# Patient Record
Sex: Female | Born: 1960 | Race: White | Hispanic: No | Marital: Married | Smoking: Never smoker
Health system: Southern US, Community
[De-identification: ages and names within clinical notes are randomized; demographics above are authoritative.]

## PROBLEM LIST (undated history)

## (undated) DIAGNOSIS — S82899A Other fracture of unspecified lower leg, initial encounter for closed fracture: Secondary | ICD-10-CM

---

## 2016-07-10 ENCOUNTER — Encounter (HOSPITAL_COMMUNITY): Payer: Self-pay | Admitting: Emergency Medicine

## 2016-07-10 ENCOUNTER — Emergency Department (HOSPITAL_COMMUNITY): Payer: Self-pay

## 2016-07-10 ENCOUNTER — Emergency Department (HOSPITAL_COMMUNITY)
Admission: EM | Admit: 2016-07-10 | Discharge: 2016-07-10 | Disposition: A | Discharge status: Home / Self Care | Payer: Self-pay | Attending: Emergency Medicine | Admitting: Emergency Medicine

## 2016-07-10 DIAGNOSIS — Y999 Unspecified external cause status: Secondary | ICD-10-CM | POA: Insufficient documentation

## 2016-07-10 DIAGNOSIS — Y939 Activity, unspecified: Secondary | ICD-10-CM | POA: Insufficient documentation

## 2016-07-10 DIAGNOSIS — W010XXA Fall on same level from slipping, tripping and stumbling without subsequent striking against object, initial encounter: Secondary | ICD-10-CM | POA: Insufficient documentation

## 2016-07-10 DIAGNOSIS — S82891A Other fracture of right lower leg, initial encounter for closed fracture: Secondary | ICD-10-CM | POA: Insufficient documentation

## 2016-07-10 DIAGNOSIS — Y929 Unspecified place or not applicable: Secondary | ICD-10-CM | POA: Insufficient documentation

## 2016-07-10 MED ORDER — OXYCODONE-ACETAMINOPHEN 5-325 MG PO TABS: 1.0000 | ORAL_TABLET | ORAL | 0 refills | Status: DC | PRN

## 2016-07-10 MED ORDER — HYDROMORPHONE HCL 1 MG/ML IJ SOLN
1.0000 mg | Freq: Once | INTRAMUSCULAR | Status: AC
  Administered 2016-07-10: 1 mg via INTRAVENOUS
  Filled 2016-07-10: qty 1

## 2016-07-10 MED ORDER — ETOMIDATE 2 MG/ML IV SOLN
0.2000 mg/kg | Freq: Once | INTRAVENOUS | Status: AC
  Administered 2016-07-10: 15 mg via INTRAVENOUS
  Filled 2016-07-10: qty 10

## 2016-07-10 NOTE — ED Triage Notes (Signed)
Per EMS pt slipped on wet carpet and landed on right ankle. Pt has obvious swelling and deformity to right ankle. Able to move all toes and strong pulse.

## 2016-07-10 NOTE — ED Provider Notes (Signed)
[facility]-EMERGENCY DEPT Provider Note   CSN: [id number] Arrival date & time: 07/10/16  1437     History   Chief Complaint Chief Complaint  Patient presents with  . Ankle Injury    HPI Sherry Cross is a 56 y.o. female.  HPI Patient presents the emergency department complaining of severe pain in her right ankle after tripping and falling today and slipping on wet ground.  She presents with obvious deformity consistent with closed right ankle fracture.  Tubular toes.  She denies knee pain.  No hip pain.  No head injury.  Denies neck pain.   History reviewed. No pertinent past medical history.  There are no active problems to display for this patient.   History reviewed. No pertinent surgical history.  OB History    No data available       Home Medications    Prior to Admission medications   Not on File    Family History No family history on file.  Social History Social History  Substance Use Topics  . Smoking status: Never Smoker  . Smokeless tobacco: Not on file  . Alcohol use No     Allergies   Patient has no known allergies.   Review of Systems Review of Systems  All other systems reviewed and are negative.    Physical Exam Updated Vital Signs BP 138/80   Pulse 73   Temp 98.3 F (36.8 C) (Oral)   Resp (!) 23   Ht  (1.676 m)   Wt 180 lb (81.6 kg)   SpO2 97%   BMI 29.05 kg/m   Physical Exam  Constitutional: She is oriented to person, place, and time. She appears well-developed and well-nourished.  HENT:  Head: Normocephalic.  Eyes: EOM are normal.  Neck: Normal range of motion. Neck supple.  Cardiovascular: Normal rate.   Pulmonary/Chest: Effort normal. She exhibits no tenderness.  Abdominal: She exhibits no distension. There is no tenderness.  Musculoskeletal: Normal range of motion.  Obvious deformity of the right ankle with small abrasion to the right medial malleolus.  There is no open component to this fracture.  Normal  pulses in the right foot.  Compartments are soft.  Neurological: She is alert and oriented to person, place, and time.  Psychiatric: She has a normal mood and affect.  Nursing note and vitals reviewed.    ED Treatments / Results  Labs (all labs ordered are listed, but only abnormal results are displayed) Labs Reviewed - No data to display  EKG  EKG Interpretation None       Radiology Dg Ankle Complete Right  Result Date: 07/10/2016 CLINICAL DATA:  Status post reduction of right ankle fracture. EXAM: RIGHT ANKLE - COMPLETE 3+ VIEW COMPARISON:  Radiographs of same day. FINDINGS: There has been casting and immobilization of the moderately displaced oblique fracture involving the distal right fibula. There is improved alignment of fracture components compared to prior exam. Posterior dislocation of talus relative to tibia has been successfully reduced. IMPRESSION: Successful reduction of talotibial dislocation. Improved alignment of distal right fibular fracture. Electronically Signed   By: Lupita Raider, M.D.   On: 07/10/2016 16:24   Dg Ankle Complete Right  Result Date: 07/10/2016 CLINICAL DATA:  Twisting injury today. Right ankle pain and swelling. Initial encounter. EXAM: RIGHT ANKLE - COMPLETE 3+ VIEW COMPARISON:  None. FINDINGS: The bones are demineralized. There is a comminuted, moderately displaced and angulated fracture of the distal fibula with resulting posterolateral subluxation of the talus  relative to the tibial plafond. The medial malleolus appears intact, although there is a suspected fracture of the tibial plafond posteriorly on the lateral view. The tibiotalar joint is widened anteriorly and medially. No tarsal bone fractures are seen. IMPRESSION: Right ankle fracture/ dislocation as described with comminuted and displaced fracture of the distal fibula, posterolateral displacement of the talus and associated deltoid ligamentous injury. Suspected posterior tibial plafond  fracture. Electronically Signed   By:Carey Bullocksazey M.D.   On: 07/10/2016 15:18    +++++++++++++++++++++++++++++++++++++++++++++++++++  Procedures Reduction of Fracture Dislocation Performed by: Azalia Bilis Authorized by: Azalia Bilis    Consent: Verbal consent obtained. Risks and benefits: risks, benefits and alternatives were discussed Consent given by: patient Required items: required blood products, implants, devices, and special equipment available Time out: Immediately prior to procedure a "time out" was called to verify the correct patient, procedure, equipment, support staff and site/side marked as required. Patient sedated: YES Vitals: Vital signs were monitored during sedation. Patient tolerance: Patient tolerated the procedure well with no immediate complications. Joint: right ankle Reduction technique: manipulation  Procedural sedation Performed by: Lyanne Co Consent: Verbal consent obtained. Risks and benefits: risks, benefits and alternatives were discussed Required items: required blood products, implants, devices, and special equipment available Patient identity confirmed: arm band and provided demographic data Time out: Immediately prior to procedure a "time out" was called to verify the correct patient, procedure, equipment, support staff and site/side marked as required. Sedation type: moderate (conscious) sedation NPO time confirmed and considedered Sedatives: ETOMIDATE Physician Time at Bedside: 12 Vitals: Vital signs were monitored during sedation. Cardiac Monitor, pulse oximeter Patient tolerance: Patient tolerated the procedure well with no immediate complications. Comments: Pt with uneventful recovered. Returned to pre-procedural sedation baseline   SPLINT APPLICATION Authorized by: Lyanne Co Consent: Verbal consent obtained. Risks and benefits: risks, benefits and alternatives were discussed Consent given by: patient Splint applied  by: orthopedic technician Location details: right lower leg Splint type: shortleg with stirrup Supplies used: plaster Post-procedure: The splinted body part was neurovascularly unchanged following the procedure. Patient tolerance: Patient tolerated the procedure well with no immediate complications.  +++++++++++++++++++++++++++++++++++++++++++++++++    Medications Ordered in ED Medications  HYDROmorphone (DILAUDID) injection 1 mg (1 mg Intravenous Given 07/10/16 1531)  etomidate (AMIDATE) injection 16.32 mg (15 mg Intravenous Given 07/10/16 1544)     Initial Impression / Assessment and Plan / ED Course  I have reviewed the triage vital signs and the nursing notes.  Pertinent labs & imaging results that were available during my care of the patient were reviewed by me and considered in my medical decision making (see chart for details).     Reduction of fracture dislocation.  Patient will follow-up with orthopedic surgery.  Home with pain medicine, elevation, nonweightbearing status right lower extremity  Final Clinical Impressions(s) / ED Diagnoses   Final diagnoses:  Closed right ankle fracture, initial encounter    New Prescriptions Discharge Medication List as of 07/10/2016  4:42 PM    START taking these medications   Details  oxyCODONE-acetaminophen (PERCOCET/ROXICET) 5-325 MG tablet Take 1 tablet by mouth every 4 (four) hours as needed for severe pain., Starting Mon 07/10/2016, Print         Azalia Bilis, MD 07/10/16 [phone]

## 2016-07-10 NOTE — ED Notes (Signed)
Bed: WA06 Expected date:  Expected time:  Means of arrival: Ambulance Comments: EMS ankle deformity 56 yo F

## 2016-07-19 ENCOUNTER — Encounter (HOSPITAL_BASED_OUTPATIENT_CLINIC_OR_DEPARTMENT_OTHER): Payer: Self-pay | Admitting: *Deleted

## 2016-07-20 ENCOUNTER — Ambulatory Visit (HOSPITAL_BASED_OUTPATIENT_CLINIC_OR_DEPARTMENT_OTHER)
Admission: RE | Admit: 2016-07-20 | Discharge: 2016-07-20 | Disposition: A | Discharge status: Home / Self Care | Payer: Self-pay | Source: Ambulatory Visit | Attending: Orthopedic Surgery | Admitting: Orthopedic Surgery

## 2016-07-20 ENCOUNTER — Encounter (HOSPITAL_BASED_OUTPATIENT_CLINIC_OR_DEPARTMENT_OTHER)
Admission: RE | Disposition: A | Discharge status: Home / Self Care | Payer: Self-pay | Source: Ambulatory Visit | Attending: Orthopedic Surgery

## 2016-07-20 ENCOUNTER — Encounter (HOSPITAL_BASED_OUTPATIENT_CLINIC_OR_DEPARTMENT_OTHER): Payer: Self-pay | Admitting: *Deleted

## 2016-07-20 ENCOUNTER — Ambulatory Visit (HOSPITAL_BASED_OUTPATIENT_CLINIC_OR_DEPARTMENT_OTHER): Payer: Self-pay | Admitting: Anesthesiology

## 2016-07-20 DIAGNOSIS — S8261XA Displaced fracture of lateral malleolus of right fibula, initial encounter for closed fracture: Secondary | ICD-10-CM | POA: Insufficient documentation

## 2016-07-20 DIAGNOSIS — W109XXA Fall (on) (from) unspecified stairs and steps, initial encounter: Secondary | ICD-10-CM | POA: Insufficient documentation

## 2016-07-20 DIAGNOSIS — Z79899 Other long term (current) drug therapy: Secondary | ICD-10-CM | POA: Insufficient documentation

## 2016-07-20 HISTORY — PX: ORIF ANKLE FRACTURE: SHX5408

## 2016-07-20 HISTORY — DX: Other fracture of unspecified lower leg, initial encounter for closed fracture: S82.899A

## 2016-07-20 SURGERY — OPEN REDUCTION INTERNAL FIXATION (ORIF) ANKLE FRACTURE
Anesthesia: General | Site: Ankle | Laterality: Right

## 2016-07-20 MED ORDER — POVIDONE-IODINE 10 % EX SWAB: 2.0000 "application " | Freq: Once | CUTANEOUS | Status: DC

## 2016-07-20 MED ORDER — FENTANYL CITRATE (PF) 100 MCG/2ML IJ SOLN
INTRAMUSCULAR | Status: AC
Start: 2016-07-20 — End: 2016-07-20
  Filled 2016-07-20: qty 2

## 2016-07-20 MED ORDER — MIDAZOLAM HCL 2 MG/2ML IJ SOLN
1.0000 mg | INTRAMUSCULAR | Status: DC | PRN
  Administered 2016-07-20: 2 mg via INTRAVENOUS

## 2016-07-20 MED ORDER — SCOPOLAMINE 1 MG/3DAYS TD PT72: 1.0000 | MEDICATED_PATCH | Freq: Once | TRANSDERMAL | Status: DC | PRN

## 2016-07-20 MED ORDER — CHLORHEXIDINE GLUCONATE 4 % EX LIQD: 60.0000 mL | Freq: Once | CUTANEOUS | Status: DC

## 2016-07-20 MED ORDER — ONDANSETRON HCL 4 MG/2ML IJ SOLN
INTRAMUSCULAR | Status: AC
Start: 2016-07-20 — End: 2016-07-20
  Filled 2016-07-20: qty 2

## 2016-07-20 MED ORDER — CEFAZOLIN SODIUM-DEXTROSE 2-4 GM/100ML-% IV SOLN
INTRAVENOUS | Status: AC
Start: 2016-07-20 — End: 2016-07-20
  Filled 2016-07-20: qty 100

## 2016-07-20 MED ORDER — DEXAMETHASONE SODIUM PHOSPHATE 10 MG/ML IJ SOLN
INTRAMUSCULAR | Status: AC
Start: 2016-07-20 — End: 2016-07-20
  Filled 2016-07-20: qty 1

## 2016-07-20 MED ORDER — PROMETHAZINE HCL 25 MG/ML IJ SOLN: 6.2500 mg | INTRAMUSCULAR | Status: DC | PRN

## 2016-07-20 MED ORDER — FENTANYL CITRATE (PF) 100 MCG/2ML IJ SOLN
50.0000 ug | INTRAMUSCULAR | Status: AC | PRN
  Administered 2016-07-20: 50 ug via INTRAVENOUS
  Administered 2016-07-20: 25 ug via INTRAVENOUS
  Administered 2016-07-20 (×2): 50 ug via INTRAVENOUS
  Administered 2016-07-20: 25 ug via INTRAVENOUS

## 2016-07-20 MED ORDER — BUPIVACAINE-EPINEPHRINE (PF) 0.5% -1:200000 IJ SOLN
INTRAMUSCULAR | Status: DC | PRN
  Administered 2016-07-20: 25 mL via PERINEURAL

## 2016-07-20 MED ORDER — 0.9 % SODIUM CHLORIDE (POUR BTL) OPTIME
TOPICAL | Status: DC | PRN
  Administered 2016-07-20: 400 mL

## 2016-07-20 MED ORDER — LACTATED RINGERS IV SOLN: INTRAVENOUS | Status: DC

## 2016-07-20 MED ORDER — ONDANSETRON HCL 4 MG/2ML IJ SOLN
INTRAMUSCULAR | Status: DC | PRN
  Administered 2016-07-20: 4 mg via INTRAVENOUS

## 2016-07-20 MED ORDER — ROPIVACAINE HCL 5 MG/ML IJ SOLN
INTRAMUSCULAR | Status: DC | PRN
  Administered 2016-07-20: 20 mL via PERINEURAL

## 2016-07-20 MED ORDER — CEFAZOLIN SODIUM-DEXTROSE 2-4 GM/100ML-% IV SOLN
2.0000 g | INTRAVENOUS | Status: AC
  Administered 2016-07-20: 2 g via INTRAVENOUS

## 2016-07-20 MED ORDER — HYDROMORPHONE HCL 1 MG/ML IJ SOLN: 0.2500 mg | INTRAMUSCULAR | Status: DC | PRN

## 2016-07-20 MED ORDER — ASPIRIN EC 81 MG PO TBEC: 81.0000 mg | DELAYED_RELEASE_TABLET | Freq: Two times a day (BID) | ORAL | 0 refills | Status: AC

## 2016-07-20 MED ORDER — PROPOFOL 10 MG/ML IV BOLUS
INTRAVENOUS | Status: DC | PRN
  Administered 2016-07-20: 200 mg via INTRAVENOUS

## 2016-07-20 MED ORDER — DEXAMETHASONE SODIUM PHOSPHATE 4 MG/ML IJ SOLN
INTRAMUSCULAR | Status: DC | PRN
  Administered 2016-07-20: 10 mg via INTRAVENOUS

## 2016-07-20 MED ORDER — LIDOCAINE 2% (20 MG/ML) 5 ML SYRINGE
INTRAMUSCULAR | Status: DC | PRN
  Administered 2016-07-20: 100 mg via INTRAVENOUS

## 2016-07-20 MED ORDER — FENTANYL CITRATE (PF) 100 MCG/2ML IJ SOLN
INTRAMUSCULAR | Status: AC
  Filled 2016-07-20: qty 2

## 2016-07-20 MED ORDER — LACTATED RINGERS IV SOLN
INTRAVENOUS | Status: DC
  Administered 2016-07-20: 14:00:00 via INTRAVENOUS

## 2016-07-20 MED ORDER — MIDAZOLAM HCL 2 MG/2ML IJ SOLN
INTRAMUSCULAR | Status: AC
  Filled 2016-07-20: qty 2

## 2016-07-20 MED ORDER — LIDOCAINE 2% (20 MG/ML) 5 ML SYRINGE
INTRAMUSCULAR | Status: AC
  Filled 2016-07-20: qty 5

## 2016-07-20 MED ORDER — OXYCODONE HCL 5 MG PO TABS: 5.0000 mg | ORAL_TABLET | ORAL | 0 refills | Status: AC | PRN

## 2016-07-20 SURGICAL SUPPLY — 78 items
BANDAGE ESMARK 6X9 LF (GAUZE/BANDAGES/DRESSINGS) ×1 IMPLANT
BIT DRILL 2.5X2.75 QC CALB (BIT) ×3 IMPLANT
BLADE SURG 15 STRL LF DISP TIS (BLADE) ×2 IMPLANT
BLADE SURG 15 STRL SS (BLADE) ×4
BNDG COHESIVE 4X5 TAN STRL (GAUZE/BANDAGES/DRESSINGS) ×3 IMPLANT
BNDG COHESIVE 6X5 TAN STRL LF (GAUZE/BANDAGES/DRESSINGS) ×3 IMPLANT
BNDG ESMARK 4X9 LF (GAUZE/BANDAGES/DRESSINGS) IMPLANT
BNDG ESMARK 6X9 LF (GAUZE/BANDAGES/DRESSINGS) ×3
CANISTER SUCT 1200ML W/VALVE (MISCELLANEOUS) ×3 IMPLANT
CHLORAPREP W/TINT 26ML (MISCELLANEOUS) ×3 IMPLANT
COVER BACK TABLE 60X90IN (DRAPES) ×3 IMPLANT
COVER MAYO STAND STRL (DRAPES) ×3 IMPLANT
CUFF TOURNIQUET SINGLE 34IN LL (TOURNIQUET CUFF) ×3 IMPLANT
DECANTER SPIKE VIAL GLASS SM (MISCELLANEOUS) IMPLANT
DRAPE EXTREMITY T 121X128X90 (DRAPE) ×3 IMPLANT
DRAPE OEC MINIVIEW 54X84 (DRAPES) ×3 IMPLANT
DRAPE U-SHAPE 47X51 STRL (DRAPES) ×3 IMPLANT
DRSG MEPITEL 4X7.2 (GAUZE/BANDAGES/DRESSINGS) ×3 IMPLANT
DRSG PAD ABDOMINAL 8X10 ST (GAUZE/BANDAGES/DRESSINGS) ×6 IMPLANT
ELECT REM PT RETURN 9FT ADLT (ELECTROSURGICAL) ×3
ELECTRODE REM PT RTRN 9FT ADLT (ELECTROSURGICAL) ×1 IMPLANT
GAUZE SPONGE 4X4 12PLY STRL (GAUZE/BANDAGES/DRESSINGS) ×3 IMPLANT
GLOVE BIO SURGEON STRL SZ8 (GLOVE) ×3 IMPLANT
GLOVE BIOGEL M STRL SZ7.5 (GLOVE) ×3 IMPLANT
GLOVE BIOGEL PI IND STRL 7.0 (GLOVE) ×1 IMPLANT
GLOVE BIOGEL PI IND STRL 8 (GLOVE) ×3 IMPLANT
GLOVE BIOGEL PI INDICATOR 7.0 (GLOVE) ×2
GLOVE BIOGEL PI INDICATOR 8 (GLOVE) ×6
GLOVE ECLIPSE 8.0 STRL XLNG CF (GLOVE) IMPLANT
GOWN STRL REUS W/ TWL LRG LVL3 (GOWN DISPOSABLE) IMPLANT
GOWN STRL REUS W/ TWL XL LVL3 (GOWN DISPOSABLE) ×2 IMPLANT
GOWN STRL REUS W/TWL LRG LVL3 (GOWN DISPOSABLE)
GOWN STRL REUS W/TWL XL LVL3 (GOWN DISPOSABLE) ×4
K-WIRE ACE 1.6X6 (WIRE) ×6
KWIRE ACE 1.6X6 (WIRE) ×2 IMPLANT
NEEDLE HYPO 22GX1.5 SAFETY (NEEDLE) IMPLANT
NS IRRIG 1000ML POUR BTL (IV SOLUTION) ×3 IMPLANT
PACK BASIN DAY SURGERY FS (CUSTOM PROCEDURE TRAY) ×3 IMPLANT
PAD CAST 4YDX4 CTTN HI CHSV (CAST SUPPLIES) ×1 IMPLANT
PADDING CAST ABS 4INX4YD NS (CAST SUPPLIES)
PADDING CAST ABS COTTON 4X4 ST (CAST SUPPLIES) IMPLANT
PADDING CAST COTTON 4X4 STRL (CAST SUPPLIES) ×2
PADDING CAST COTTON 6X4 STRL (CAST SUPPLIES) ×3 IMPLANT
PENCIL BUTTON HOLSTER BLD 10FT (ELECTRODE) ×3 IMPLANT
PLATE LOCK 8H 103 BILAT FIB (Plate) ×3 IMPLANT
SANITIZER HAND PURELL 535ML FO (MISCELLANEOUS) ×3 IMPLANT
SCREW CORTICAL 3.5MM  28MM (Screw) ×2 IMPLANT
SCREW CORTICAL 3.5MM 28MM (Screw) ×1 IMPLANT
SCREW CORTICAL LOW PROF 3.5X20 (Screw) ×3 IMPLANT
SCREW LOCK CORT STAR 3.5X10 (Screw) ×3 IMPLANT
SCREW LOCK CORT STAR 3.5X14 (Screw) ×3 IMPLANT
SCREW LOCK CORT STAR 3.5X16 (Screw) ×3 IMPLANT
SCREW LOCK CORT STAR 3.5X18 (Screw) ×3 IMPLANT
SCREW NON LOCKING LP 3.5 14MM (Screw) ×3 IMPLANT
SHEET MEDIUM DRAPE 40X70 STRL (DRAPES) ×3 IMPLANT
SLEEVE SCD COMPRESS KNEE MED (MISCELLANEOUS) ×3 IMPLANT
SPLINT FAST PLASTER 5X30 (CAST SUPPLIES) ×40
SPLINT PLASTER CAST FAST 5X30 (CAST SUPPLIES) ×20 IMPLANT
SPONGE LAP 18X18 X RAY DECT (DISPOSABLE) ×3 IMPLANT
STOCKINETTE 6  STRL (DRAPES) ×2
STOCKINETTE 6 STRL (DRAPES) ×1 IMPLANT
SUCTION FRAZIER HANDLE 10FR (MISCELLANEOUS) ×2
SUCTION TUBE FRAZIER 10FR DISP (MISCELLANEOUS) ×1 IMPLANT
SUT ETHILON 3 0 PS 1 (SUTURE) ×3 IMPLANT
SUT FIBERWIRE #2 38 T-5 BLUE (SUTURE)
SUT MNCRL AB 3-0 PS2 18 (SUTURE) ×6 IMPLANT
SUT PDS AB 1 CT  36 (SUTURE) ×2
SUT PDS AB 1 CT 36 (SUTURE) ×1 IMPLANT
SUT VIC AB 0 SH 27 (SUTURE) IMPLANT
SUT VIC AB 2-0 SH 27 (SUTURE) ×2
SUT VIC AB 2-0 SH 27XBRD (SUTURE) ×1 IMPLANT
SUTURE FIBERWR #2 38 T-5 BLUE (SUTURE) IMPLANT
SYR BULB 3OZ (MISCELLANEOUS) ×3 IMPLANT
SYR CONTROL 10ML LL (SYRINGE) IMPLANT
TOWEL OR 17X24 6PK STRL BLUE (TOWEL DISPOSABLE) ×6 IMPLANT
TUBE CONNECTING 20'X1/4 (TUBING) ×1
TUBE CONNECTING 20X1/4 (TUBING) ×2 IMPLANT
UNDERPAD 30X30 (UNDERPADS AND DIAPERS) ×3 IMPLANT

## 2016-07-20 NOTE — Transfer of Care (Signed)
Immediate Anesthesia Transfer of Care Note  Patient: Sherry Cross  Procedure(s) Performed: Procedure(s): OPEN REDUCTION INTERNAL FIXATION (ORIF) ANKLE FRACTURE (Right)  Patient Location: PACU  Anesthesia Type:General  Level of Consciousness: awake and oriented  Airway & Oxygen Therapy: Patient Spontanous Breathing and Patient connected to face mask oxygen  Post-op Assessment: Report given to RN and Post -op Vital signs reviewed and stable  Post vital signs: Reviewed and stable  Last Vitals:  Vitals:   07/20/16 1430 07/20/16 1703  BP: 135/76 128/80  Pulse: 80 94  Resp: 20 15  Temp:      Last Pain:  Vitals:   07/20/16 1340  TempSrc:   PainSc: 0-No pain      Patients Stated Pain Goal: 2 (07/19/16 1141)  Complications: No apparent anesthesia complications

## 2016-07-20 NOTE — Anesthesia Procedure Notes (Signed)
Procedure Name: LMA Insertion Performed by: Migdalia Olejniczak W Pre-anesthesia Checklist: Patient identified, Emergency Drugs available, Suction available and Patient being monitored Patient Re-evaluated:Patient Re-evaluated prior to inductionOxygen Delivery Method: Circle system utilized Preoxygenation: Pre-oxygenation with 100% oxygen Intubation Type: IV induction Ventilation: Mask ventilation without difficulty LMA: LMA inserted LMA Size: 4.0 Number of attempts: 1 Placement Confirmation: positive ETCO2 Tube secured with: Tape Dental Injury: Teeth and Oropharynx as per pre-operative assessment        

## 2016-07-20 NOTE — Progress Notes (Signed)
Assisted Dr. Rob Fitzgerald with right, ultrasound guided, popliteal/saphenous block. Side rails up, monitors on throughout procedure. See vital signs in flow sheet. Tolerated Procedure well. 

## 2016-07-20 NOTE — Discharge Instructions (Signed)
Sherry ArthursJohn [name], MD [facility]  Please read the following information regarding your care after surgery.  Medications  You only need a prescription for the narcotic pain medicine (ex. oxycodone, Percocet, Norco).  All of the other medicines listed below are available over the counter. X acetominophen (Tylenol) 650 mg every 4-6 hours as you need for minor pain X oxycodone as prescribed for moderate to severe pain X Aleve 2 pills twice a day for 5 days after surgery   Narcotic pain medicine (ex. oxycodone, Percocet, Vicodin) will cause constipation.  To prevent this problem, take the following medicines while you are taking any pain medicine. X docusate sodium (Colace) 100 mg twice a day X senna (Senokot) 2 tablets twice a day  X To help prevent blood clots, take a baby aspirin (81 mg) twice a day for two weeks after surgery.  You should also get up every hour while you are awake to move around.    Weight Bearing ? Bear weight when you are able on your operated leg or foot. ? Bear weight only on the heel of your operated foot in the post-op shoe. X Do not bear any weight on the operated leg or foot.  Cast / Splint / Dressing X Keep your splint or cast clean and dry.  Dont put anything (coat hanger, pencil, etc) down inside of it.  If it gets damp, use a hair dryer on the cool setting to dry it.  If it gets soaked, call the office to schedule an appointment for a cast change. ? Remove your dressing 3 days after surgery and cover the incisions with dry dressings.    After your dressing, cast or splint is removed; you may shower, but do not soak or scrub the wound.  Allow the water to run over it, and then gently pat it dry.  Swelling It is normal for you to have swelling where you had surgery.  To reduce swelling and pain, keep your toes above your nose for at least 3 days after surgery.  It may be necessary to keep your foot or leg elevated for several weeks.  If it hurts, it  should be elevated.  Follow Up Call my office at [phone] when you are discharged from the hospital or surgery center to schedule an appointment to be seen two weeks after surgery.  Call my office at [phone] if you develop a fever >101.5 F, nausea, vomiting, bleeding from the surgical site or severe pain.     Post Anesthesia Home Care Instructions  Activity: Get plenty of rest for the remainder of the day. A responsible individual must stay with you for 24 hours following the procedure.  For the next 24 hours, DO NOT: -Drive a car -Advertising copywriterperate machinery -Drink alcoholic beverages -Take any medication unless instructed by your physician -Make any legal decisions or sign important papers.  Meals: Start with liquid foods such as gelatin or soup. Progress to regular foods as tolerated. Avoid greasy, spicy, heavy foods. If nausea and/or vomiting occur, drink only clear liquids until the nausea and/or vomiting subsides. Call your physician if vomiting continues.  Special Instructions/Symptoms: Your throat may feel dry or sore from the anesthesia or the breathing tube placed in your throat during surgery. If this causes discomfort, gargle with warm salt water. The discomfort should disappear within 24 hours.  If you had a scopolamine patch placed behind your ear for the management of post- operative nausea and/or vomiting:  1. The medication in the  patch is effective for 72 hours, after which it should be removed.  Wrap patch in a tissue and discard in the trash. Wash hands thoroughly with soap and water. 2. You may remove the patch earlier than 72 hours if you experience unpleasant side effects which may include dry mouth, dizziness or visual disturbances. 3. Avoid touching the patch. Wash your hands with soap and water after contact with the patch.    Regional Anesthesia Blocks  1. Numbness or the inability to move the "blocked" extremity may last from 3-48 hours after placement.  The length of time depends on the medication injected and your individual response to the medication. If the numbness is not going away after 48 hours, call your surgeon.  2. The extremity that is blocked will need to be protected until the numbness is gone and the  Strength has returned. Because you cannot feel it, you will need to take extra care to avoid injury. Because it may be weak, you may have difficulty moving it or using it. You may not know what position it is in without looking at it while the block is in effect.  3. For blocks in the legs and feet, returning to weight bearing and walking needs to be done carefully. You will need to wait until the numbness is entirely gone and the strength has returned. You should be able to move your leg and foot normally before you try and bear weight or walk. You will need someone to be with you when you first try to ensure you do not fall and possibly risk injury.  4. Bruising and tenderness at the needle site are common side effects and will resolve in a few days.  5. Persistent numbness or new problems with movement should be communicated to the surgeon or the [facility] [phone] [facility] [phone]).

## 2016-07-20 NOTE — H&P (Signed)
Sherry Cross is an 56 y.o. female.   Chief Complaint: right ankle fracture HPI:  56 y/o female with right ankle fracture after a fall down a few steps last week.  She c/o aching pain.  She's been immobilized in a splint since her visit to the ER.  She denies any h/o smoking, diabetes, thyroid problems, previous right ankle injury and blood clots.  Past Medical History:  Diagnosis Date  . Ankle fracture    right    Past Surgical History:  Procedure Laterality Date  . CESAREAN SECTION      History reviewed. No pertinent family history. Social History:  reports that she has never smoked. She has never used smokeless tobacco. She reports that she does not drink alcohol or use drugs.  Allergies: No Known Allergies  Medications Prior to Admission  Medication Sig Dispense Refill  . bisacodyl (BISACODYL) 5 MG EC tablet Take 5 mg by mouth daily as needed for moderate constipation.    Marland Kitchen. oxyCODONE-acetaminophen (PERCOCET/ROXICET) 5-325 MG tablet Take 1 tablet by mouth every 4 (four) hours as needed for severe pain. 25 tablet 0    No results found for this or any previous visit (from the past 48 hour(s)). No results found.  ROS  No recent f/c/n/v/wt loss  Blood pressure 135/76, pulse 80, temperature 99.2 F (37.3 C), temperature source Oral, resp. rate 20, height 5\' 6"  (1.676 m), weight 78.7 kg (173 lb 6.4 oz), SpO2 97 %. Physical Exam  wn wd woman in nad.  A and O x 4.  Mood and affect normal.  EOMI.  resp unlabored.  R ankle with moderate swelling.  No gross deformity.  Fracture blisters posteromedially and posterolaterally around the ankle.  No lymphadenopathy.  5/5 strength in PF and DF of the ankle and toes.  Sens to LT intact at the foot dorsally and plantarly.  Assessment/Plan R ankle comminuted lateral malleolus fracture and deltoid ligament disruption - to OR for open treatment of the right ankle injury.  The risks and benefits of the alternative treatment options have been  discussed in detail.  The patient wishes to proceed with surgery and specifically understands risks of bleeding, infection, nerve damage, blood clots, need for additional surgery, amputation and death.   Toni ArthursHEWITT, Kaiea Esselman, MD 07/20/2016, 2:52 PM

## 2016-07-20 NOTE — Brief Op Note (Signed)
07/20/2016  5:02 PM  PATIENT:  Sherry Cross  56 y.o. female  PRE-OPERATIVE DIAGNOSIS:  Right ankle lateral malleolus fracture  POST-OPERATIVE DIAGNOSIS:  Severely comminuted right ankle lateral malleolus fracture  Procedure(s): 1.  Open treatment of right ankle lateral malleolus fracture with internal fixation 2.  Stress exam of right ankle under flruoro 3.  AP, mortise and lateral xrays of the right ankle  SURGEON:  Toni ArthursJohn Liel Rudden, MD  ASSISTANT: n/a  ANESTHESIA:   General, regional  EBL:  minimal   TOURNIQUET:   Total Tourniquet Time Documented: Thigh (Right) - 82 minutes Total: Thigh (Right) - 82 minutes  COMPLICATIONS:  None apparent  DISPOSITION:  Extubated, awake and stable to recovery.  DICTATION ID:  [id number]

## 2016-07-20 NOTE — Anesthesia Procedure Notes (Signed)
Anesthesia Regional Block: Popliteal block   Pre-Anesthetic Checklist: ,, timeout performed, Correct Patient, Correct Site, Correct Laterality, Correct Procedure, Correct Position, site marked, Risks and benefits discussed,  Surgical consent,  Pre-op evaluation,  At surgeon's request and post-op pain management  Laterality: Right  Prep: chloraprep       Needles:  Injection technique: Single-shot  Needle Type: Echogenic Needle     Needle Length: 9cm  Needle Gauge: 21     Additional Needles:   Procedures: ultrasound guided, nerve stimulator,,,,,,   Nerve Stimulator or Paresthesia:  Response: tibial and peroneal, 0.5 mA,   Additional Responses:   Narrative:  Start time: 07/20/2016 2:12 PM End time: 07/20/2016 2:21 PM Injection made incrementally with aspirations every 5 mL.  Performed by: Personally  Anesthesiologist: Marcene DuosFITZGERALD, Cheetara Hoge

## 2016-07-20 NOTE — Anesthesia Preprocedure Evaluation (Signed)
Anesthesia Evaluation  Patient identified by MRN, date of birth, ID band Patient awake    Reviewed: Allergy & Precautions, NPO status , Patient's Chart, lab work & pertinent test results  Airway Mallampati: II  TM Distance: >3 FB Neck ROM: Full    Dental  (+) Dental Advisory Given   Pulmonary neg pulmonary ROS,    breath sounds clear to auscultation       Cardiovascular negative cardio ROS   Rhythm:Regular Rate:Normal     Neuro/Psych negative neurological ROS     GI/Hepatic negative GI ROS, Neg liver ROS,   Endo/Other  negative endocrine ROS  Renal/GU negative Renal ROS     Musculoskeletal   Abdominal   Peds  Hematology negative hematology ROS (+)   Anesthesia Other Findings   Reproductive/Obstetrics                             Anesthesia Physical Anesthesia Plan  ASA: I  Anesthesia Plan: General   Post-op Pain Management:  Regional for Post-op pain   Induction: Intravenous  Airway Management Planned: LMA  Additional Equipment:   Intra-op Plan:   Post-operative Plan: Extubation in OR  Informed Consent: I have reviewed the patients History and Physical, chart, labs and discussed the procedure including the risks, benefits and alternatives for the proposed anesthesia with the patient or authorized representative who has indicated his/her understanding and acceptance.   Dental advisory given  Plan Discussed with:   Anesthesia Plan Comments:         Anesthesia Quick Evaluation  

## 2016-07-20 NOTE — Anesthesia Procedure Notes (Signed)
Anesthesia Regional Block: Adductor canal block   Pre-Anesthetic Checklist: ,, timeout performed, Correct Patient, Correct Site, Correct Laterality, Correct Procedure, Correct Position, site marked, Risks and benefits discussed,  Surgical consent,  Pre-op evaluation,  At surgeon's request and post-op pain management  Laterality: Right  Prep: chloraprep       Needles:  Injection technique: Single-shot  Needle Type: Echogenic Needle     Needle Length: 9cm  Needle Gauge: 21     Additional Needles:   Procedures: ultrasound guided,,,,,,,,  Narrative:  Start time: 07/20/2016 2:22 PM End time: 07/20/2016 2:28 PM Injection made incrementally with aspirations every 5 mL.  Performed by: Personally  Anesthesiologist: Marcene DuosFITZGERALD, Regina Ganci

## 2016-07-21 ENCOUNTER — Encounter (HOSPITAL_BASED_OUTPATIENT_CLINIC_OR_DEPARTMENT_OTHER): Payer: Self-pay | Admitting: Orthopedic Surgery

## 2016-07-21 NOTE — Op Note (Signed)
Sherry Cross, Sherry Cross              ACCOUNT NO.:  [id number]  MEDICAL RECORD NO.:  [id number]  LOCATION:                                 FACILITY:  PHYSICIAN:  Toni ArthursJohn Virgal Warmuth, MD             DATE OF BIRTH:  DATE OF PROCEDURE:  07/20/2016 DATE OF DISCHARGE:                              OPERATIVE REPORT   PREOPERATIVE DIAGNOSIS:  Right ankle lateral malleolus fracture.  POSTOPERATIVE DIAGNOSIS:  Severely-comminuted right ankle lateral malleolus fracture.  PROCEDURES: 1. Open treatment of right ankle lateral malleolus fracture with     internal fixation. 2. Stress examination of the right ankle under fluoroscopy. 3. AP, mortise and lateral radiographs of the right ankle.  SURGEON:  Toni ArthursJohn Elliannah Wayment, MD  ANESTHESIA:  General, regional.  ESTIMATED BLOOD LOSS:  Minimal.  TOURNIQUET TIME:  82 minutes at 250 mmHg.  COMPLICATIONS:  None apparent.  DISPOSITION:  Extubated, awake and stable to recovery.  INDICATIONS FOR PROCEDURE:  The patient is a 56 year old woman who tripped walking down some stairs injuring her right ankle approximately 10 days ago.  She sustained a comminuted fracture of the lateral malleolus.  She presents now for operative treatment of this unstable and displaced ankle fracture.  She understands the risks and benefits of the alternative treatment options and elects surgical treatment.  She specifically understands risks of bleeding, infection, nerve damage, blood clots, need for additional surgery, continued pain, posttraumatic arthritis, amputation and death.  PROCEDURE IN DETAIL:  After preoperative consent was obtained and the correct operative site was identified, the patient was brought to the operating room and placed supine on the operating table.  General anesthesia was induced.  Preoperative antibiotics were administered. Surgical time-out was taken.  The right lower extremity was prepped and draped in standard sterile fashion with a tourniquet around  the thigh. Extremity was exsanguinated and the tourniquet was inflated to 250 mmHg. A longitudinal incision was then made over the lateral malleolus.  Sharp dissection was carried down through the skin and subcutaneous tissue. Fracture site was identified.  The fracture was noted to be quite comminuted.  There was a transverse element just distal to the level of the syndesmosis and then, the distal fragment was fractured in the coronal plane longitudinally and the origin of the CFL was also pulled off as a large avulsed fragment.  Several attempts were made at interfragmentary reduction.  These were unsuccessful.  An 8-hole locking Bi[employer] ALPS plate was selected.  It was contoured to fit the lateral malleolus and secured to the distal fragment with locking and nonlocking screws.  It was then used as a reduction tool to pull the lateral fragments out to length.  It was secured to the proximal fragments with locking and nonlocking screws.  The avulsed fragment of the distal fibula was then reduced to the plate at its distal-most hole and secured with a locking plate.  The large posterior fragment was then reduced and compressed to the anterior fragments with a tenaculum.  One 3.5-mm lag screw was then inserted from posterior to anterior across the fracture site.  AP, mortise, and lateral radiographs confirmed appropriate position and length  of all hardware and appropriate reduction of the fractures.  Stress examination was then performed.  Dorsiflexion and external rotation stress were applied under live fluoroscopy.  There was no widening of the medial clear space or syndesmosis.  The wound was then irrigated copiously.  The deep subcutaneous tissue and peroneal retinaculum were repaired with simple sutures of Vicryl.  A #1 PDS had been used to repair the periosteum over some of the smaller fragments at the posterior distal fibula.  Superficial subcutaneous tissues were then approximated  with Monocryl and the skin incision was closed with horizontal mattress sutures of 3-0 nylon.  Sterile dressings were applied followed by a well-padded short-leg splint.  Tourniquet was released after application of the dressings at 82 minutes.  The patient was awakened from anesthesia and transported to the recovery room in stable condition.  FOLLOWUP PLAN:  The patient will be nonweightbearing on the right lower extremity.  She will follow up with me in the office in 2 weeks for suture removal and conversion to a short-leg cast.  We will plan nonweightbearing for 6 weeks postop.  X-rays at 3 views of the right ankle are obtained intraoperatively. These show interval reduction and fixation of the severely-comminuted right ankle lateral malleolus fracture.  No other acute injuries are noted.     Toni Arthurs, MD     JH/MEDQ  D:  07/20/2016  T:  07/21/2016  Job:  [id number]

## 2016-07-21 NOTE — Anesthesia Postprocedure Evaluation (Addendum)
Anesthesia Post Note  Patient: Sherry Cross  Procedure(s) Performed: Procedure(s) (LRB): OPEN REDUCTION INTERNAL FIXATION (ORIF) ANKLE FRACTURE (Right)  Patient location during evaluation: PACU Anesthesia Type: General Level of consciousness: awake and alert Pain management: pain level controlled Vital Signs Assessment: post-procedure vital signs reviewed and stable Respiratory status: spontaneous breathing, nonlabored ventilation, respiratory function stable and patient connected to nasal cannula oxygen Cardiovascular status: blood pressure returned to baseline and stable Postop Assessment: no signs of nausea or vomiting Anesthetic complications: no       Last Vitals:  Vitals:   07/20/16 1730 07/20/16 1749  BP: (!) 142/84 (!) 145/77  Pulse: 87 83  Resp: 15 16  Temp:  36.4 C    Last Pain:  Vitals:   07/20/16 1749  TempSrc:   PainSc: 0-No pain                 Lyman Balingit,JAMES TERRILL

## 2016-08-11 NOTE — Addendum Note (Signed)
Addendum  created 08/11/16 1453 by Vanissa Strength, MD   Sign clinical note    

## 2018-03-24 IMAGING — CR DG ANKLE COMPLETE 3+V*R*
3 series · 3 of 3 positions shown · non-contrast
Comparison: None.

CLINICAL DATA: Twisting injury today. Right ankle pain and
swelling. Initial encounter.

EXAM:
RIGHT ANKLE - COMPLETE 3+ VIEW

[x ankle ap right]
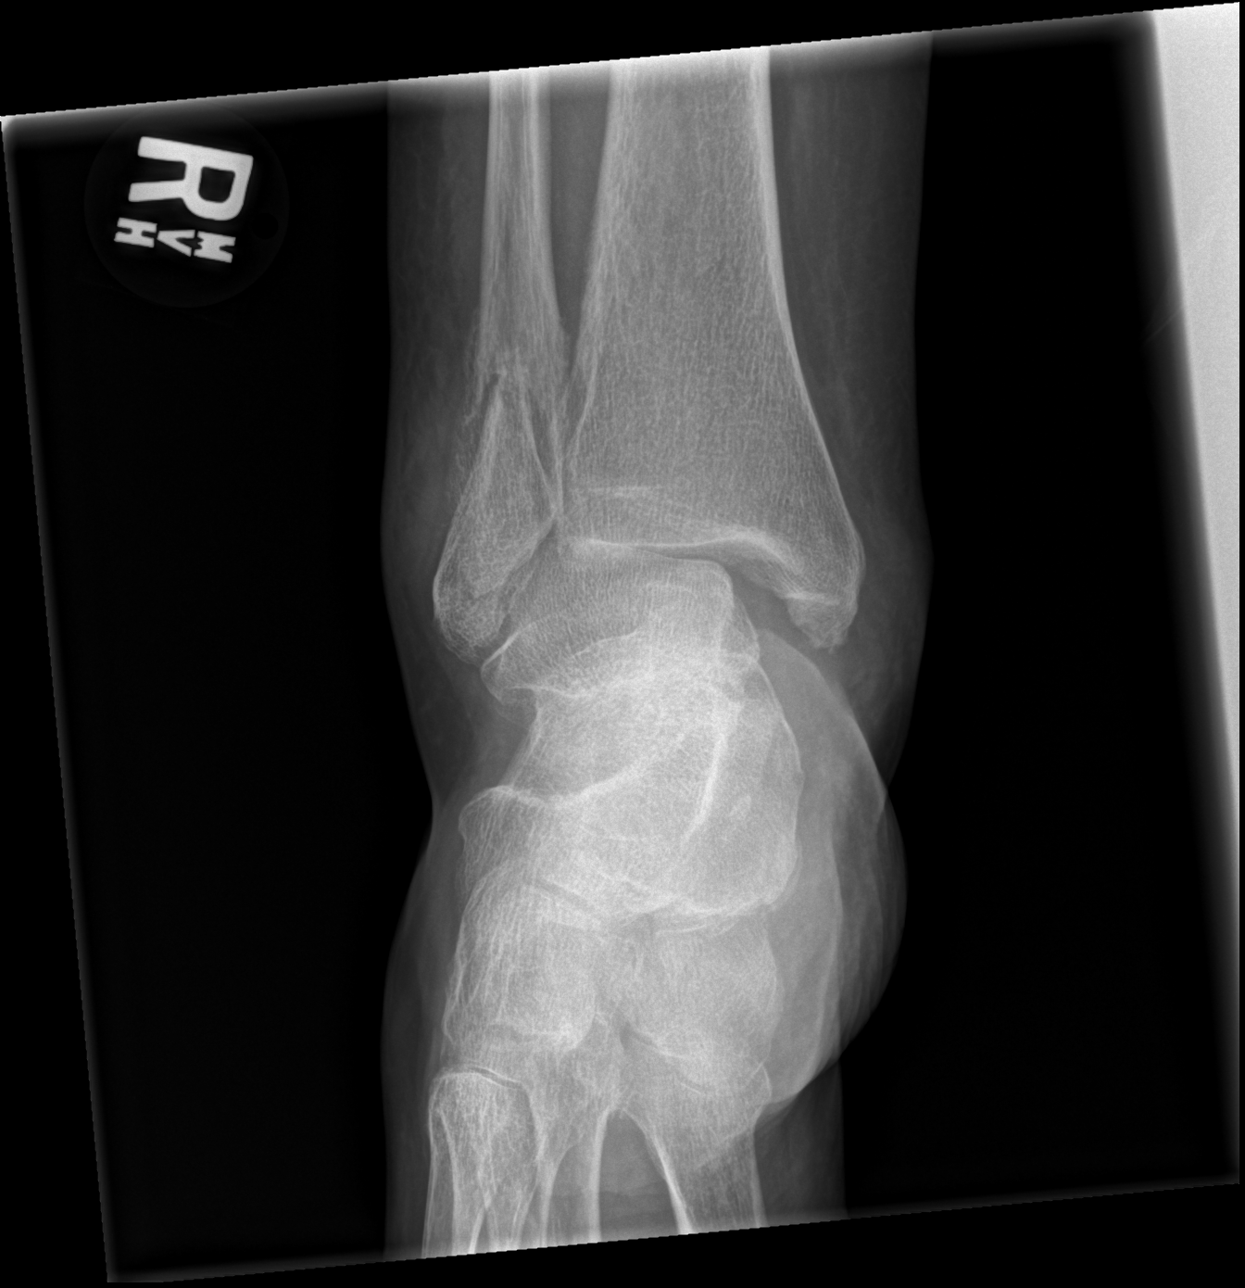

[x ankle obl right]
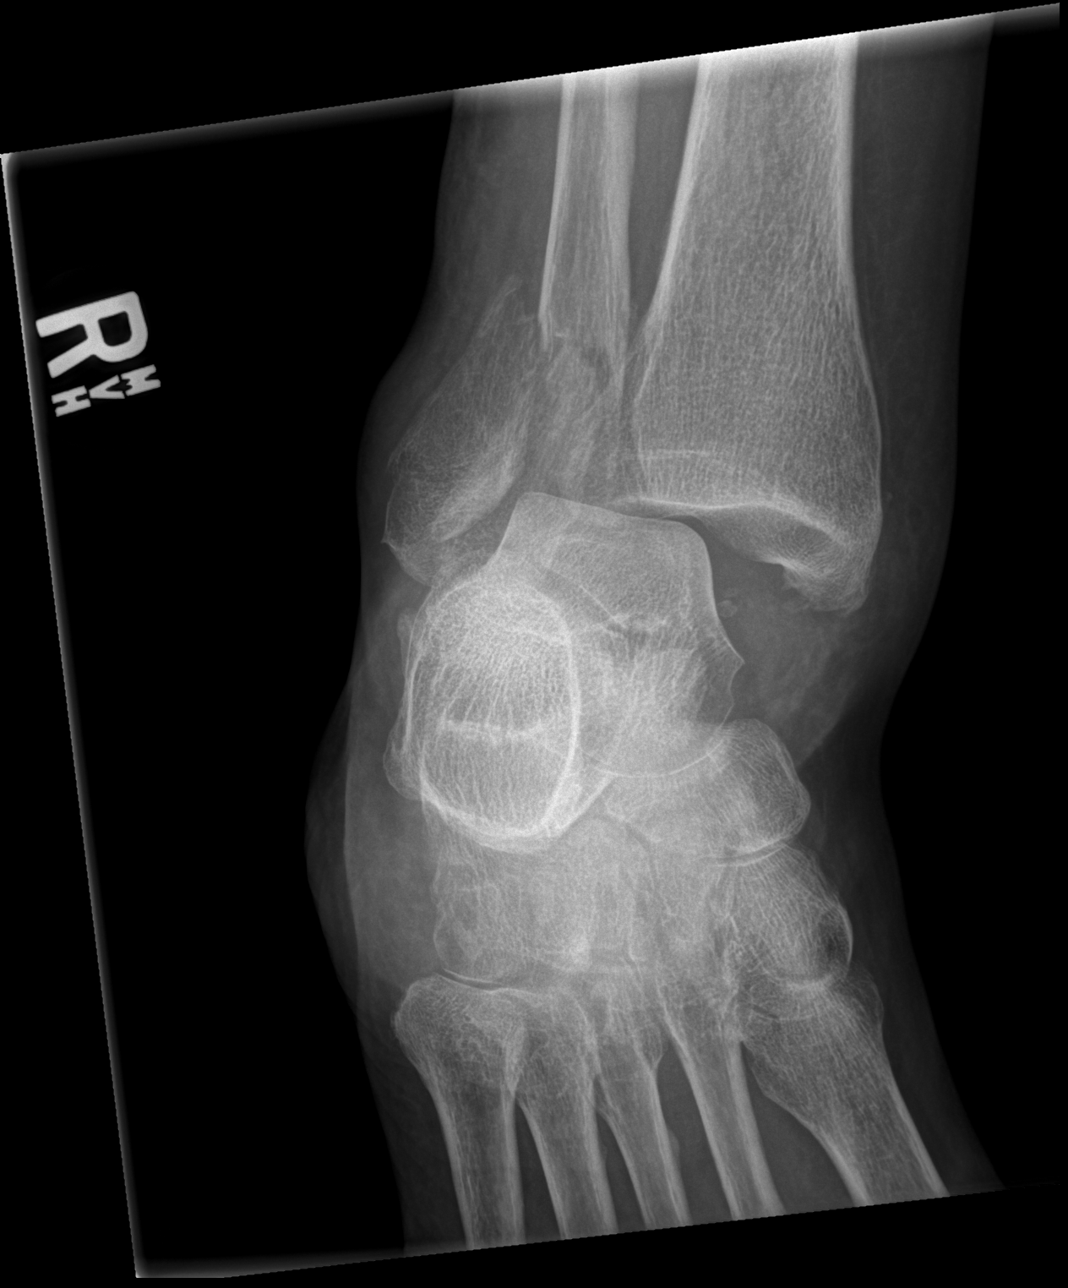

[x ankle lat right]
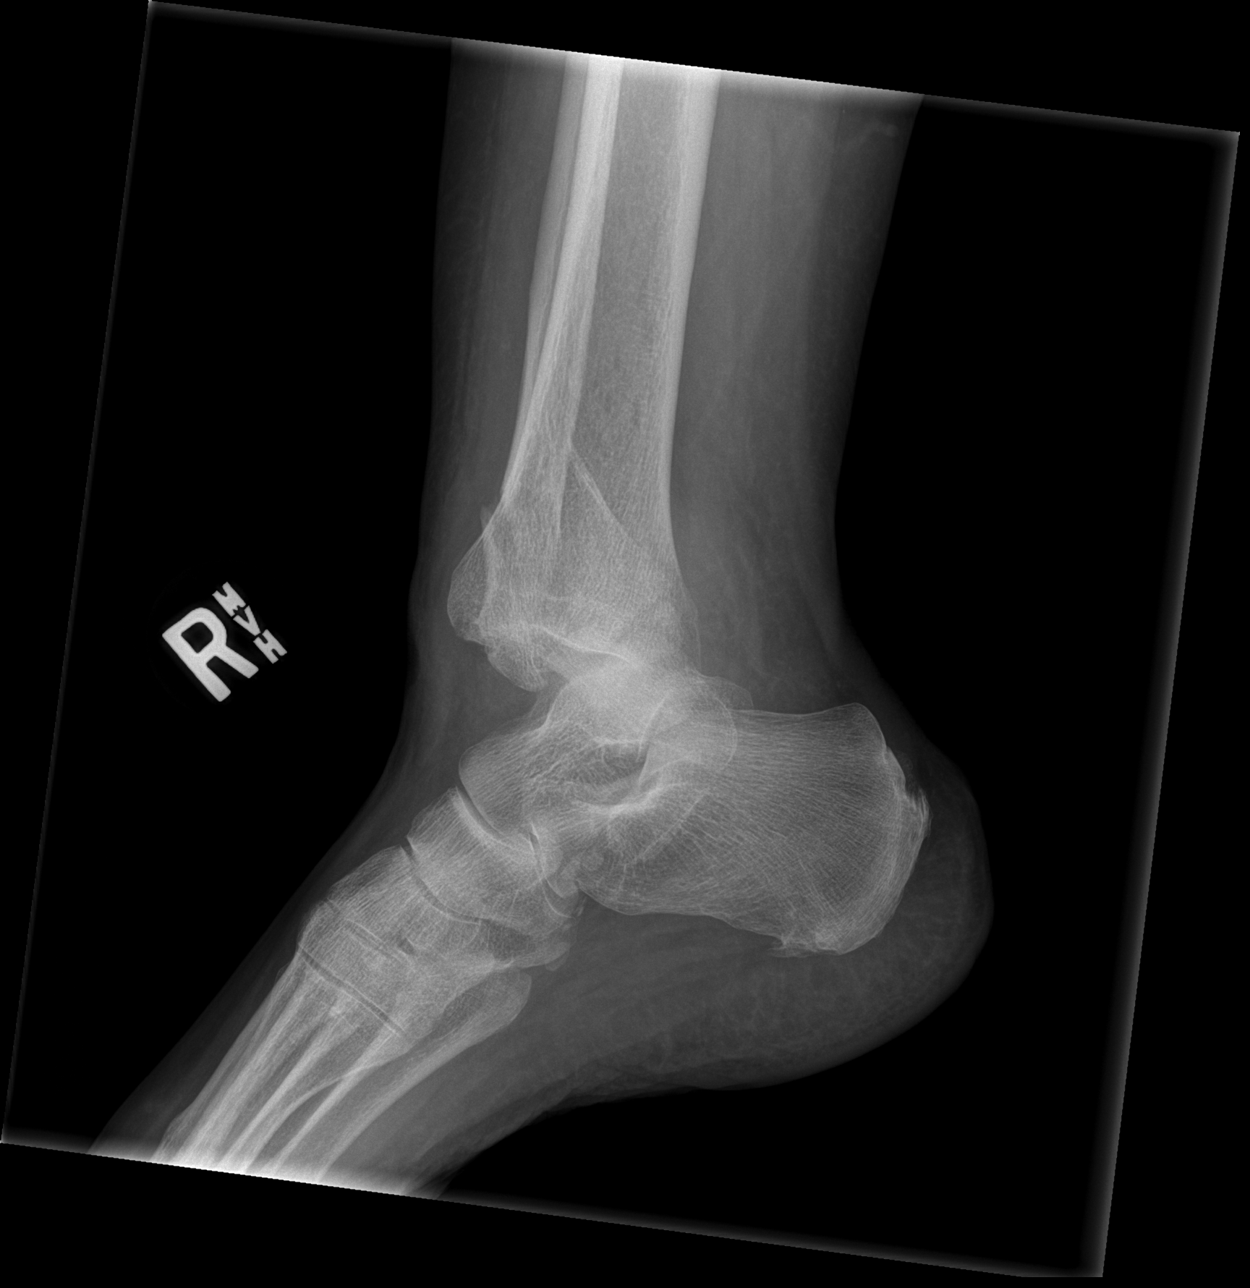

[3 of 3 positions shown; findings below may reference images not displayed]

FINDINGS: The bones are demineralized. There is a comminuted, moderately
displaced and angulated fracture of the distal fibula with resulting
posterolateral subluxation of the talus relative to the tibial
plafond. The medial malleolus appears intact, although there is a
suspected fracture of the tibial plafond posteriorly on the lateral
view. The tibiotalar joint is widened anteriorly and medially. No
tarsal bone fractures are seen.
IMPRESSION: Right ankle fracture/ dislocation as described with comminuted and
displaced fracture of the distal fibula, posterolateral displacement
of the talus and associated deltoid ligamentous injury. Suspected
posterior tibial plafond fracture.
